# Patient Record
Sex: Female | Born: 1980 | Race: White | Hispanic: No | Marital: Single | Smoking: Never smoker
Health system: Southern US, Community
[De-identification: ages and names within clinical notes are randomized; demographics above are authoritative.]

## PROBLEM LIST (undated history)

## (undated) DIAGNOSIS — O24419 Gestational diabetes mellitus in pregnancy, unspecified control: Secondary | ICD-10-CM

## (undated) HISTORY — DX: Gestational diabetes mellitus in pregnancy, unspecified control: O24.419

---

## 2017-02-15 DIAGNOSIS — F411 Generalized anxiety disorder: Secondary | ICD-10-CM | POA: Diagnosis not present

## 2017-03-01 DIAGNOSIS — F411 Generalized anxiety disorder: Secondary | ICD-10-CM | POA: Diagnosis not present

## 2017-03-15 DIAGNOSIS — F411 Generalized anxiety disorder: Secondary | ICD-10-CM | POA: Diagnosis not present

## 2017-03-29 DIAGNOSIS — F411 Generalized anxiety disorder: Secondary | ICD-10-CM | POA: Diagnosis not present

## 2017-04-19 DIAGNOSIS — F411 Generalized anxiety disorder: Secondary | ICD-10-CM | POA: Diagnosis not present

## 2017-05-10 DIAGNOSIS — F411 Generalized anxiety disorder: Secondary | ICD-10-CM | POA: Diagnosis not present

## 2017-05-24 DIAGNOSIS — F411 Generalized anxiety disorder: Secondary | ICD-10-CM | POA: Diagnosis not present

## 2017-06-21 DIAGNOSIS — F411 Generalized anxiety disorder: Secondary | ICD-10-CM | POA: Diagnosis not present

## 2017-07-13 DIAGNOSIS — F411 Generalized anxiety disorder: Secondary | ICD-10-CM | POA: Diagnosis not present

## 2017-08-02 DIAGNOSIS — F411 Generalized anxiety disorder: Secondary | ICD-10-CM | POA: Diagnosis not present

## 2017-09-06 DIAGNOSIS — F411 Generalized anxiety disorder: Secondary | ICD-10-CM | POA: Diagnosis not present

## 2017-09-07 DIAGNOSIS — Z23 Encounter for immunization: Secondary | ICD-10-CM | POA: Diagnosis not present

## 2017-10-11 DIAGNOSIS — F411 Generalized anxiety disorder: Secondary | ICD-10-CM | POA: Diagnosis not present

## 2017-11-15 DIAGNOSIS — F411 Generalized anxiety disorder: Secondary | ICD-10-CM | POA: Diagnosis not present

## 2017-12-08 DIAGNOSIS — Z01419 Encounter for gynecological examination (general) (routine) without abnormal findings: Secondary | ICD-10-CM | POA: Diagnosis not present

## 2017-12-08 DIAGNOSIS — Z113 Encounter for screening for infections with a predominantly sexual mode of transmission: Secondary | ICD-10-CM | POA: Diagnosis not present

## 2017-12-08 DIAGNOSIS — Z6823 Body mass index (BMI) 23.0-23.9, adult: Secondary | ICD-10-CM | POA: Diagnosis not present

## 2017-12-08 DIAGNOSIS — Z124 Encounter for screening for malignant neoplasm of cervix: Secondary | ICD-10-CM | POA: Diagnosis not present

## 2017-12-13 DIAGNOSIS — F411 Generalized anxiety disorder: Secondary | ICD-10-CM | POA: Diagnosis not present

## 2018-01-13 DIAGNOSIS — F411 Generalized anxiety disorder: Secondary | ICD-10-CM | POA: Diagnosis not present

## 2018-01-24 DIAGNOSIS — D229 Melanocytic nevi, unspecified: Secondary | ICD-10-CM | POA: Diagnosis not present

## 2018-02-07 DIAGNOSIS — F411 Generalized anxiety disorder: Secondary | ICD-10-CM | POA: Diagnosis not present

## 2018-03-14 DIAGNOSIS — F411 Generalized anxiety disorder: Secondary | ICD-10-CM | POA: Diagnosis not present

## 2018-04-11 DIAGNOSIS — F411 Generalized anxiety disorder: Secondary | ICD-10-CM | POA: Diagnosis not present

## 2018-05-16 DIAGNOSIS — F411 Generalized anxiety disorder: Secondary | ICD-10-CM | POA: Diagnosis not present

## 2018-06-13 DIAGNOSIS — F411 Generalized anxiety disorder: Secondary | ICD-10-CM | POA: Diagnosis not present

## 2018-07-07 DIAGNOSIS — F411 Generalized anxiety disorder: Secondary | ICD-10-CM | POA: Diagnosis not present

## 2018-08-12 DIAGNOSIS — F411 Generalized anxiety disorder: Secondary | ICD-10-CM | POA: Diagnosis not present

## 2018-09-05 DIAGNOSIS — F411 Generalized anxiety disorder: Secondary | ICD-10-CM | POA: Diagnosis not present

## 2018-10-03 DIAGNOSIS — F411 Generalized anxiety disorder: Secondary | ICD-10-CM | POA: Diagnosis not present

## 2018-12-07 DIAGNOSIS — Z6823 Body mass index (BMI) 23.0-23.9, adult: Secondary | ICD-10-CM | POA: Diagnosis not present

## 2018-12-07 DIAGNOSIS — Z124 Encounter for screening for malignant neoplasm of cervix: Secondary | ICD-10-CM | POA: Diagnosis not present

## 2018-12-07 DIAGNOSIS — Z01419 Encounter for gynecological examination (general) (routine) without abnormal findings: Secondary | ICD-10-CM | POA: Diagnosis not present

## 2018-12-08 DIAGNOSIS — Z124 Encounter for screening for malignant neoplasm of cervix: Secondary | ICD-10-CM | POA: Diagnosis not present

## 2019-01-03 DIAGNOSIS — J111 Influenza due to unidentified influenza virus with other respiratory manifestations: Secondary | ICD-10-CM | POA: Diagnosis not present

## 2019-06-20 ENCOUNTER — Other Ambulatory Visit: Payer: Self-pay

## 2019-06-20 DIAGNOSIS — Z20822 Contact with and (suspected) exposure to covid-19: Secondary | ICD-10-CM

## 2019-06-21 LAB — NOVEL CORONAVIRUS, NAA: SARS-CoV-2, NAA: NOT DETECTED

## 2019-06-22 ENCOUNTER — Telehealth: Payer: Self-pay

## 2019-06-22 NOTE — Telephone Encounter (Signed)
Pt. Called back, given COVID 19 results. 

## 2019-09-20 DIAGNOSIS — Z20828 Contact with and (suspected) exposure to other viral communicable diseases: Secondary | ICD-10-CM | POA: Diagnosis not present

## 2019-09-20 DIAGNOSIS — Z7189 Other specified counseling: Secondary | ICD-10-CM | POA: Diagnosis not present

## 2019-09-23 DIAGNOSIS — Z20828 Contact with and (suspected) exposure to other viral communicable diseases: Secondary | ICD-10-CM | POA: Diagnosis not present

## 2019-09-25 DIAGNOSIS — Z7189 Other specified counseling: Secondary | ICD-10-CM | POA: Diagnosis not present

## 2019-09-25 DIAGNOSIS — Z20828 Contact with and (suspected) exposure to other viral communicable diseases: Secondary | ICD-10-CM | POA: Diagnosis not present

## 2019-09-25 DIAGNOSIS — Z03818 Encounter for observation for suspected exposure to other biological agents ruled out: Secondary | ICD-10-CM | POA: Diagnosis not present

## 2019-09-26 DIAGNOSIS — Z20828 Contact with and (suspected) exposure to other viral communicable diseases: Secondary | ICD-10-CM | POA: Diagnosis not present

## 2019-12-06 DIAGNOSIS — F4320 Adjustment disorder, unspecified: Secondary | ICD-10-CM | POA: Diagnosis not present

## 2019-12-12 DIAGNOSIS — F4320 Adjustment disorder, unspecified: Secondary | ICD-10-CM | POA: Diagnosis not present

## 2019-12-19 DIAGNOSIS — F4320 Adjustment disorder, unspecified: Secondary | ICD-10-CM | POA: Diagnosis not present

## 2019-12-21 DIAGNOSIS — Z6824 Body mass index (BMI) 24.0-24.9, adult: Secondary | ICD-10-CM | POA: Diagnosis not present

## 2019-12-21 DIAGNOSIS — Z01419 Encounter for gynecological examination (general) (routine) without abnormal findings: Secondary | ICD-10-CM | POA: Diagnosis not present

## 2019-12-21 DIAGNOSIS — Z113 Encounter for screening for infections with a predominantly sexual mode of transmission: Secondary | ICD-10-CM | POA: Diagnosis not present

## 2019-12-21 DIAGNOSIS — Z124 Encounter for screening for malignant neoplasm of cervix: Secondary | ICD-10-CM | POA: Diagnosis not present

## 2019-12-26 DIAGNOSIS — F4320 Adjustment disorder, unspecified: Secondary | ICD-10-CM | POA: Diagnosis not present

## 2020-01-02 DIAGNOSIS — F4320 Adjustment disorder, unspecified: Secondary | ICD-10-CM | POA: Diagnosis not present

## 2020-01-09 DIAGNOSIS — F4320 Adjustment disorder, unspecified: Secondary | ICD-10-CM | POA: Diagnosis not present

## 2020-01-24 DIAGNOSIS — F4320 Adjustment disorder, unspecified: Secondary | ICD-10-CM | POA: Diagnosis not present

## 2020-02-08 DIAGNOSIS — F4320 Adjustment disorder, unspecified: Secondary | ICD-10-CM | POA: Diagnosis not present

## 2020-02-27 DIAGNOSIS — F4320 Adjustment disorder, unspecified: Secondary | ICD-10-CM | POA: Diagnosis not present

## 2020-03-12 DIAGNOSIS — F4320 Adjustment disorder, unspecified: Secondary | ICD-10-CM | POA: Diagnosis not present

## 2020-04-09 DIAGNOSIS — F4320 Adjustment disorder, unspecified: Secondary | ICD-10-CM | POA: Diagnosis not present

## 2020-04-23 DIAGNOSIS — F4320 Adjustment disorder, unspecified: Secondary | ICD-10-CM | POA: Diagnosis not present

## 2020-05-07 DIAGNOSIS — F4320 Adjustment disorder, unspecified: Secondary | ICD-10-CM | POA: Diagnosis not present

## 2020-06-04 DIAGNOSIS — F4320 Adjustment disorder, unspecified: Secondary | ICD-10-CM | POA: Diagnosis not present

## 2020-07-02 DIAGNOSIS — F4320 Adjustment disorder, unspecified: Secondary | ICD-10-CM | POA: Diagnosis not present

## 2020-07-06 DIAGNOSIS — Z20828 Contact with and (suspected) exposure to other viral communicable diseases: Secondary | ICD-10-CM | POA: Diagnosis not present

## 2020-07-12 DIAGNOSIS — F4320 Adjustment disorder, unspecified: Secondary | ICD-10-CM | POA: Diagnosis not present

## 2020-07-17 DIAGNOSIS — F4320 Adjustment disorder, unspecified: Secondary | ICD-10-CM | POA: Diagnosis not present

## 2020-08-06 DIAGNOSIS — F4320 Adjustment disorder, unspecified: Secondary | ICD-10-CM | POA: Diagnosis not present

## 2020-08-08 DIAGNOSIS — Z23 Encounter for immunization: Secondary | ICD-10-CM | POA: Diagnosis not present

## 2020-08-14 DIAGNOSIS — F4320 Adjustment disorder, unspecified: Secondary | ICD-10-CM | POA: Diagnosis not present

## 2020-08-28 DIAGNOSIS — F4320 Adjustment disorder, unspecified: Secondary | ICD-10-CM | POA: Diagnosis not present

## 2020-09-03 DIAGNOSIS — F4320 Adjustment disorder, unspecified: Secondary | ICD-10-CM | POA: Diagnosis not present

## 2020-09-11 DIAGNOSIS — F4320 Adjustment disorder, unspecified: Secondary | ICD-10-CM | POA: Diagnosis not present

## 2020-09-25 DIAGNOSIS — F4320 Adjustment disorder, unspecified: Secondary | ICD-10-CM | POA: Diagnosis not present

## 2020-10-09 DIAGNOSIS — F4320 Adjustment disorder, unspecified: Secondary | ICD-10-CM | POA: Diagnosis not present

## 2020-10-23 DIAGNOSIS — F4320 Adjustment disorder, unspecified: Secondary | ICD-10-CM | POA: Diagnosis not present

## 2020-11-06 DIAGNOSIS — F4320 Adjustment disorder, unspecified: Secondary | ICD-10-CM | POA: Diagnosis not present

## 2020-11-20 DIAGNOSIS — F4320 Adjustment disorder, unspecified: Secondary | ICD-10-CM | POA: Diagnosis not present

## 2020-11-28 ENCOUNTER — Ambulatory Visit: Payer: Self-pay | Admitting: Physician Assistant

## 2021-01-01 DIAGNOSIS — F4320 Adjustment disorder, unspecified: Secondary | ICD-10-CM | POA: Diagnosis not present

## 2021-01-09 ENCOUNTER — Other Ambulatory Visit: Payer: Self-pay

## 2021-01-09 ENCOUNTER — Encounter: Payer: Self-pay | Admitting: Dermatology

## 2021-01-09 ENCOUNTER — Ambulatory Visit (INDEPENDENT_AMBULATORY_CARE_PROVIDER_SITE_OTHER): Payer: BC Managed Care – PPO | Admitting: Dermatology

## 2021-01-09 DIAGNOSIS — L738 Other specified follicular disorders: Secondary | ICD-10-CM

## 2021-01-09 DIAGNOSIS — L729 Follicular cyst of the skin and subcutaneous tissue, unspecified: Secondary | ICD-10-CM

## 2021-01-09 DIAGNOSIS — Z87898 Personal history of other specified conditions: Secondary | ICD-10-CM

## 2021-01-09 DIAGNOSIS — Z1283 Encounter for screening for malignant neoplasm of skin: Secondary | ICD-10-CM

## 2021-01-09 DIAGNOSIS — Z86018 Personal history of other benign neoplasm: Secondary | ICD-10-CM

## 2021-01-09 DIAGNOSIS — B079 Viral wart, unspecified: Secondary | ICD-10-CM

## 2021-01-09 NOTE — Patient Instructions (Signed)
[email or web address]

## 2021-01-27 ENCOUNTER — Encounter: Payer: Self-pay | Admitting: Dermatology

## 2021-01-27 NOTE — Progress Notes (Signed)
   New Patient   Subjective  Tamara Mccormick is a 40 y.o. female who presents for the following: Annual Exam (Right wrist wart she picks, check mole on back raised).  General skin check plus several spots to discuss Location:  Duration:  Quality:  Associated Signs/Symptoms: Modifying Factors:  Severity:  Timing: Context: History of atypical mole.   The following portions of the chart were reviewed this encounter and updated as appropriate:  Tobacco  Allergies  Meds  Problems  Med Hx  Surg Hx  Fam Hx      Objective  Well appearing patient in no apparent distress; mood and affect are within normal limits. Objective  Chest - [facility]): Head to toe exam clear no atypical moles no skin cancer   Objective  Right Thigh - Anterior: Previous removal per patient scar clear  Objective  Mid Back: Mid back black/grey area: 8 mm open epidermoid cyst.  Objective  Right Temporal Scalp: Stable, previous tried home surgery per patient: Two millimeter of cream-colored papule with eccentric dell.  Objective  Left Forearm - Posterior: Warty 3 to 4 mm papule which may represent a viral wart, pickers papule, or irritated keratosis.  Clinically benign and historically stable.    A full examination was performed including scalp, head, eyes, ears, nose, lips, neck, chest, axillae, abdomen, back, buttocks, bilateral upper extremities, bilateral lower extremities, hands, feet, fingers, toes, fingernails, and toenails. All findings within normal limits unless otherwise noted below.  Areas beneath undergarments not examined.   Assessment & Plan  Screening exam for skin cancer Chest - [facility])  History of atypical nevus Right Thigh - Anterior  Annual skin examination, patient encouraged to self examine twice annually.  Continued use of ultraviolet protection.  Cyst of skin Mid Back  Patient may choose to schedule 30 minutes for minor surgery.  Risks  reviewed.  Sebaceous hyperplasia of face Right Temporal Scalp  Benign nature of sebaceous hyperplasia discussed.  Mimicry to early basal cell carcinoma also reviewed.  For now, we will leave the spot if it is clinically stable.  Viral warts, unspecified type Left Forearm - Posterior  Patient will decide if she wants to schedule future removal.

## 2021-02-06 DIAGNOSIS — Z1231 Encounter for screening mammogram for malignant neoplasm of breast: Secondary | ICD-10-CM | POA: Diagnosis not present

## 2021-02-06 DIAGNOSIS — Z124 Encounter for screening for malignant neoplasm of cervix: Secondary | ICD-10-CM | POA: Diagnosis not present

## 2021-02-06 DIAGNOSIS — Z31430 Encounter of female for testing for genetic disease carrier status for procreative management: Secondary | ICD-10-CM | POA: Diagnosis not present

## 2021-02-06 DIAGNOSIS — Z01419 Encounter for gynecological examination (general) (routine) without abnormal findings: Secondary | ICD-10-CM | POA: Diagnosis not present

## 2021-02-10 ENCOUNTER — Other Ambulatory Visit: Payer: Self-pay | Admitting: Obstetrics and Gynecology

## 2021-02-10 DIAGNOSIS — R928 Other abnormal and inconclusive findings on diagnostic imaging of breast: Secondary | ICD-10-CM

## 2021-03-04 ENCOUNTER — Ambulatory Visit
Admission: RE | Admit: 2021-03-04 | Discharge: 2021-03-04 | Disposition: A | Discharge status: Home / Self Care | Payer: BC Managed Care – PPO | Source: Ambulatory Visit | Attending: Obstetrics and Gynecology | Admitting: Obstetrics and Gynecology

## 2021-03-04 ENCOUNTER — Other Ambulatory Visit: Payer: Self-pay

## 2021-03-04 DIAGNOSIS — R922 Inconclusive mammogram: Secondary | ICD-10-CM | POA: Diagnosis not present

## 2021-03-04 DIAGNOSIS — N6002 Solitary cyst of left breast: Secondary | ICD-10-CM | POA: Diagnosis not present

## 2021-03-04 DIAGNOSIS — R928 Other abnormal and inconclusive findings on diagnostic imaging of breast: Secondary | ICD-10-CM

## 2021-04-16 DIAGNOSIS — N760 Acute vaginitis: Secondary | ICD-10-CM | POA: Diagnosis not present

## 2021-07-07 DIAGNOSIS — Z3201 Encounter for pregnancy test, result positive: Secondary | ICD-10-CM | POA: Diagnosis not present

## 2021-07-16 DIAGNOSIS — N925 Other specified irregular menstruation: Secondary | ICD-10-CM | POA: Diagnosis not present

## 2021-07-16 DIAGNOSIS — O3680X1 Pregnancy with inconclusive fetal viability, fetus 1: Secondary | ICD-10-CM | POA: Diagnosis not present

## 2021-07-16 DIAGNOSIS — Z113 Encounter for screening for infections with a predominantly sexual mode of transmission: Secondary | ICD-10-CM | POA: Diagnosis not present

## 2021-07-16 DIAGNOSIS — Z3480 Encounter for supervision of other normal pregnancy, unspecified trimester: Secondary | ICD-10-CM | POA: Diagnosis not present

## 2021-07-31 DIAGNOSIS — O09511 Supervision of elderly primigravida, first trimester: Secondary | ICD-10-CM | POA: Diagnosis not present

## 2021-07-31 DIAGNOSIS — Z3480 Encounter for supervision of other normal pregnancy, unspecified trimester: Secondary | ICD-10-CM | POA: Diagnosis not present

## 2021-07-31 DIAGNOSIS — Z3A09 9 weeks gestation of pregnancy: Secondary | ICD-10-CM | POA: Diagnosis not present

## 2021-07-31 LAB — OB RESULTS CONSOLE RUBELLA ANTIBODY, IGM: Rubella: IMMUNE

## 2021-07-31 LAB — OB RESULTS CONSOLE ABO/RH: RH Type: NEGATIVE

## 2021-07-31 LAB — OB RESULTS CONSOLE RPR: RPR: NONREACTIVE

## 2021-07-31 LAB — OB RESULTS CONSOLE GC/CHLAMYDIA
Chlamydia: NEGATIVE
Gonorrhea: NEGATIVE

## 2021-07-31 LAB — OB RESULTS CONSOLE ANTIBODY SCREEN: Antibody Screen: NEGATIVE

## 2021-07-31 LAB — OB RESULTS CONSOLE HIV ANTIBODY (ROUTINE TESTING): HIV: NONREACTIVE

## 2021-07-31 LAB — HEPATITIS C ANTIBODY: HCV Ab: NEGATIVE

## 2021-07-31 LAB — OB RESULTS CONSOLE HEPATITIS B SURFACE ANTIGEN: Hepatitis B Surface Ag: NEGATIVE

## 2021-08-08 DIAGNOSIS — Z23 Encounter for immunization: Secondary | ICD-10-CM | POA: Diagnosis not present

## 2021-08-27 DIAGNOSIS — Z369 Encounter for antenatal screening, unspecified: Secondary | ICD-10-CM | POA: Diagnosis not present

## 2021-09-08 DIAGNOSIS — Z3482 Encounter for supervision of other normal pregnancy, second trimester: Secondary | ICD-10-CM | POA: Diagnosis not present

## 2021-09-30 DIAGNOSIS — Z363 Encounter for antenatal screening for malformations: Secondary | ICD-10-CM | POA: Diagnosis not present

## 2021-09-30 DIAGNOSIS — Z3A18 18 weeks gestation of pregnancy: Secondary | ICD-10-CM | POA: Diagnosis not present

## 2021-10-28 DIAGNOSIS — Z369 Encounter for antenatal screening, unspecified: Secondary | ICD-10-CM | POA: Diagnosis not present

## 2021-11-09 NOTE — L&D Delivery Note (Signed)
Patient was C/C/+3 and pushed for 10 minutes withOUT epidural.    ?NSVD  female infant, Apgars 9.9, weight P.   ?The patient had a midline perineal second degree laceration repaired with 2-0 Vicryl E. ?Fundus was firm. ?EBL was expected amount. ?Placenta was delivered intact. ?Vagina was clear. ? ?Delayed cord clamping done for 30-60 seconds while warming baby. ?Baby was vigorous and doing skin to skin with mother. ? ?Loney Laurence  ?

## 2021-12-31 ENCOUNTER — Encounter: Payer: BC Managed Care – PPO | Attending: Obstetrics and Gynecology | Admitting: Registered"

## 2021-12-31 ENCOUNTER — Other Ambulatory Visit: Payer: Self-pay

## 2021-12-31 ENCOUNTER — Encounter: Payer: Self-pay | Admitting: Registered"

## 2021-12-31 DIAGNOSIS — O24419 Gestational diabetes mellitus in pregnancy, unspecified control: Secondary | ICD-10-CM | POA: Insufficient documentation

## 2021-12-31 NOTE — Progress Notes (Signed)
Patient was seen on 12/31/21 for Gestational Diabetes self-management class at the [facility]. The following learning objectives were met by the patient during this course:  States the definition of Gestational Diabetes States why dietary management is important in controlling blood glucose Describes the effects each nutrient has on blood glucose levels Demonstrates ability to create a balanced meal plan Demonstrates carbohydrate counting  States when to check blood glucose levels Demonstrates proper blood glucose monitoring techniques States the effect of stress and exercise on blood glucose levels States the importance of limiting caffeine and abstaining from alcohol and smoking  Blood glucose monitor given: Accu-chek Guide Me Lot #[id number] Exp: 01/26/2023 CBG: 87 mg/dL   Patient instructed to monitor glucose levels: FBS: 60 - <95; 1 hour: <140; 2 hour: <120  Patient received handouts: Nutrition Diabetes and Pregnancy, including carb counting list  Patient will be seen for follow-up as needed.

## 2022-01-12 ENCOUNTER — Ambulatory Visit: Payer: BC Managed Care – PPO | Admitting: Dermatology

## 2022-01-27 ENCOUNTER — Ambulatory Visit (INDEPENDENT_AMBULATORY_CARE_PROVIDER_SITE_OTHER): Payer: BC Managed Care – PPO | Admitting: Dermatology

## 2022-01-27 ENCOUNTER — Encounter: Payer: Self-pay | Admitting: Dermatology

## 2022-01-27 ENCOUNTER — Other Ambulatory Visit: Payer: Self-pay

## 2022-01-27 DIAGNOSIS — Z1283 Encounter for screening for malignant neoplasm of skin: Secondary | ICD-10-CM | POA: Diagnosis not present

## 2022-01-27 DIAGNOSIS — L729 Follicular cyst of the skin and subcutaneous tissue, unspecified: Secondary | ICD-10-CM | POA: Diagnosis not present

## 2022-02-05 LAB — OB RESULTS CONSOLE GBS: GBS: NEGATIVE

## 2022-02-08 ENCOUNTER — Encounter: Payer: Self-pay | Admitting: Dermatology

## 2022-02-08 NOTE — Progress Notes (Signed)
? ?  Follow-Up Visit ?  ?Subjective  ?Tamara Mccormick is a 41 y.o. female who presents for the following: Skin Problem (Lesion on back x all her life- the shape is changing. ). ? ?General skin examination, change in spot on back ?Location:  ?Duration:  ?Quality:  ?Associated Signs/Symptoms: ?Modifying Factors:  ?Severity:  ?Timing: ?Context:  ? ?Objective  ?Well appearing patient in no apparent distress; mood and affect are within normal limits. ?Pt is approx 8 mo pregnant. Due date 4/21.  I explained normal changes in pigmentation including loss during pregnancy.  Clinically no visibly atypical moles, all checked with dermoscopy. ? ?Mid Back ?1 cm noninflamed open cyst with Maczis 1 mm.  Centrally. ? ? ? ?All sun exposed areas plus back examined. ? ? ?Assessment & Plan  ? ? ?Screening exam for skin cancer ? ?May recheck if there is clinical change or every 1 to 2 years postpartum.  Continue ultraviolet protection. ? ?Cyst of skin ?Mid Back ? ?Discussed elective excision, patient chooses to leave unless there is clinical change. ? ? ? ? ? ?I, Janalyn Harder, MD, have reviewed all documentation for this visit.  The documentation on 02/08/22 for the exam, diagnosis, procedures, and orders are all accurate and complete. ?

## 2022-02-20 ENCOUNTER — Telehealth (HOSPITAL_COMMUNITY): Payer: Self-pay | Admitting: *Deleted

## 2022-02-20 ENCOUNTER — Encounter (HOSPITAL_COMMUNITY): Payer: Self-pay | Admitting: *Deleted

## 2022-02-20 NOTE — Telephone Encounter (Signed)
Preadmission screen  

## 2022-02-23 ENCOUNTER — Telehealth (HOSPITAL_COMMUNITY): Payer: Self-pay | Admitting: *Deleted

## 2022-02-23 ENCOUNTER — Encounter (HOSPITAL_COMMUNITY): Payer: Self-pay | Admitting: *Deleted

## 2022-02-23 NOTE — Telephone Encounter (Signed)
Preadmission screen  

## 2022-02-24 ENCOUNTER — Other Ambulatory Visit: Payer: Self-pay | Admitting: Obstetrics and Gynecology

## 2022-02-25 ENCOUNTER — Inpatient Hospital Stay (HOSPITAL_COMMUNITY)
Admission: AD | Admit: 2022-02-25 | Discharge: 2022-02-27 | DRG: 807 | Disposition: A | Discharge status: Home / Self Care | Payer: BC Managed Care – PPO | Attending: Obstetrics and Gynecology | Admitting: Obstetrics and Gynecology

## 2022-02-25 ENCOUNTER — Encounter (HOSPITAL_COMMUNITY): Payer: Self-pay | Admitting: Obstetrics and Gynecology

## 2022-02-25 ENCOUNTER — Other Ambulatory Visit: Payer: Self-pay

## 2022-02-25 ENCOUNTER — Inpatient Hospital Stay (HOSPITAL_COMMUNITY): Payer: BC Managed Care – PPO

## 2022-02-25 DIAGNOSIS — O24420 Gestational diabetes mellitus in childbirth, diet controlled: Secondary | ICD-10-CM | POA: Diagnosis present

## 2022-02-25 DIAGNOSIS — Z6791 Unspecified blood type, Rh negative: Secondary | ICD-10-CM | POA: Diagnosis not present

## 2022-02-25 DIAGNOSIS — Z3A39 39 weeks gestation of pregnancy: Secondary | ICD-10-CM | POA: Diagnosis not present

## 2022-02-25 DIAGNOSIS — O26893 Other specified pregnancy related conditions, third trimester: Principal | ICD-10-CM | POA: Diagnosis present

## 2022-02-25 DIAGNOSIS — O09513 Supervision of elderly primigravida, third trimester: Principal | ICD-10-CM | POA: Diagnosis present

## 2022-02-25 LAB — CBC
HCT: 38.3 % (ref 36.0–46.0)
Hemoglobin: 12.6 g/dL (ref 12.0–15.0)
MCH: 28.1 pg (ref 26.0–34.0)
MCHC: 32.9 g/dL (ref 30.0–36.0)
MCV: 85.5 fL (ref 80.0–100.0)
Platelets: 219 10*3/uL (ref 150–400)
RBC: 4.48 MIL/uL (ref 3.87–5.11)
RDW: 13.3 % (ref 11.5–15.5)
WBC: 8.2 10*3/uL (ref 4.0–10.5)
nRBC: 0 % (ref 0.0–0.2)

## 2022-02-25 LAB — TYPE AND SCREEN
ABO/RH(D): O NEG
Antibody Screen: POSITIVE

## 2022-02-25 LAB — RPR: RPR Ser Ql: NONREACTIVE

## 2022-02-25 MED ORDER — LACTATED RINGERS IV SOLN: 500.0000 mL | INTRAVENOUS | Status: DC | PRN

## 2022-02-25 MED ORDER — ONDANSETRON HCL 4 MG/2ML IJ SOLN: 4.0000 mg | Freq: Four times a day (QID) | INTRAMUSCULAR | Status: DC | PRN

## 2022-02-25 MED ORDER — OXYTOCIN-SODIUM CHLORIDE 30-0.9 UT/500ML-% IV SOLN
1.0000 m[IU]/min | INTRAVENOUS | Status: DC
  Administered 2022-02-25: 2 m[IU]/min via INTRAVENOUS
  Filled 2022-02-25 (×2): qty 500

## 2022-02-25 MED ORDER — FLEET ENEMA 7-19 GM/118ML RE ENEM: 1.0000 | ENEMA | RECTAL | Status: DC | PRN

## 2022-02-25 MED ORDER — ACETAMINOPHEN 325 MG PO TABS
650.0000 mg | ORAL_TABLET | ORAL | Status: DC | PRN
Start: 2022-02-25 — End: 2022-02-26

## 2022-02-25 MED ORDER — OXYCODONE-ACETAMINOPHEN 5-325 MG PO TABS: 1.0000 | ORAL_TABLET | ORAL | Status: DC | PRN

## 2022-02-25 MED ORDER — LIDOCAINE HCL (PF) 1 % IJ SOLN
30.0000 mL | INTRAMUSCULAR | Status: AC | PRN
  Administered 2022-02-26: 30 mL via SUBCUTANEOUS
  Filled 2022-02-25: qty 30

## 2022-02-25 MED ORDER — OXYTOCIN BOLUS FROM INFUSION
333.0000 mL | Freq: Once | INTRAVENOUS | Status: AC
  Administered 2022-02-26: 333 mL via INTRAVENOUS

## 2022-02-25 MED ORDER — LACTATED RINGERS IV SOLN: INTRAVENOUS | Status: DC

## 2022-02-25 MED ORDER — SOD CITRATE-CITRIC ACID 500-334 MG/5ML PO SOLN: 30.0000 mL | ORAL | Status: DC | PRN

## 2022-02-25 MED ORDER — TERBUTALINE SULFATE 1 MG/ML IJ SOLN: 0.2500 mg | Freq: Once | INTRAMUSCULAR | Status: DC | PRN

## 2022-02-25 MED ORDER — OXYCODONE-ACETAMINOPHEN 5-325 MG PO TABS: 2.0000 | ORAL_TABLET | ORAL | Status: DC | PRN

## 2022-02-25 MED ORDER — OXYTOCIN-SODIUM CHLORIDE 30-0.9 UT/500ML-% IV SOLN: 2.5000 [IU]/h | INTRAVENOUS | Status: DC

## 2022-02-25 NOTE — Progress Notes (Signed)
Offered cervical exam, pt deferred at this time and would like to wait a little bit longer. ?

## 2022-02-25 NOTE — Progress Notes (Signed)
Pt doing well without epidural. ? ?Vitals:  ? 02/25/22 0958 02/25/22 1056 02/25/22 1255 02/25/22 1344  ?BP: 118/82 120/66 104/78 107/69  ?Pulse: 80 76 78 82  ?Resp: 16 18 16    ?Temp: 98.3 ?F (36.8 ?C)     ?TempSrc: Oral     ?  ?FHTs 130s, gSTV, NST R, occ mild variable ?Toco q 3 ?SVE not checked since this am. ? ?A/P Continue induction at term.  ?

## 2022-02-25 NOTE — H&P (Signed)
41 y.o. [redacted]w[redacted]d  G1P0 comes in induction at term for AMA.  Otherwise has good fetal movement and no bleeding.  Pt had foley bulb placed in office yesterday and spontaneously fell out last night. ? ?Past Medical History:  ?Diagnosis Date  ? Gestational diabetes   ? No past surgical history on file.  ?OB History  ?Gravida Para Term Preterm AB Living  ?1            ?SAB IAB Ectopic Multiple Live Births  ?           ?  ?# Outcome Date GA Lbr Len/2nd Weight Sex Delivery Anes PTL Lv  ?1 Current           ?  ?Social History  ? ?Socioeconomic History  ? Marital status: Single  ?  Spouse name: Not on file  ? Number of children: Not on file  ? Years of education: Not on file  ? Highest education level: Not on file  ?Occupational History  ? Not on file  ?Tobacco Use  ? Smoking status: Never  ? Smokeless tobacco: Never  ?Vaping Use  ? Vaping Use: Never used  ?Substance and Sexual Activity  ? Alcohol use: Not Currently  ? Drug use: Never  ? Sexual activity: Not on file  ?Other Topics Concern  ? Not on file  ?Social History Narrative  ? Not on file  ? ?Social Determinants of Health  ? ?Financial Resource Strain: Not on file  ?Food Insecurity: Not on file  ?Transportation Needs: Not on file  ?Physical Activity: Not on file  ?Stress: Not on file  ?Social Connections: Not on file  ?Intimate Partner Violence: Not on file  ? Patient has no known allergies.  ? ? ?Prenatal Transfer Tool  ?Maternal Diabetes: Yes:  Diabetes Type:  Diet controlled ?Genetic Screening: Normal- low risk NIPT ?Maternal Ultrasounds/Referrals: Normal ?Fetal Ultrasounds or other Referrals:  None ?Maternal Substance Abuse:  No ?Significant Maternal Medications:  Meds include: Zoloft ?Significant Maternal Lab Results: GBS negative received Rhogam 28 weeks. ? ?Other PNC: Otherwise uncomplicated. ? ? ? ?There were no vitals filed for this visit. ? ?Lungs/Cor:  NAD ?Abdomen:  soft, gravid ?Ex:  no cords, erythema ?SVE:  4/60/-2 ?FHTs:  120s, good STV, NST R; Cat 1  tracing. ?Toco:  q 4 ? ? ?A/P   Term induction for AMA. ? GBS neg. ? ?Tamara Mccormick  ?

## 2022-02-25 NOTE — Progress Notes (Signed)
Vitals:  ? 02/25/22 1530 02/25/22 1730 02/25/22 1808 02/25/22 1937  ?BP: 116/76 (!) 149/87 120/77 106/72  ?Pulse: 72 89 66 81  ?Resp: 18 16    ?Temp: 97.9 ?F (36.6 ?C)  98.3 ?F (36.8 ?C)   ?TempSrc: Oral     ?Weight:      ?Height:      ?  ?FHTs 120s, gSTV, NST R ?Toco q 3-4 ?SVE pt has declined exam since AROM. ? ?Continue pitocin induction.  ?

## 2022-02-26 ENCOUNTER — Encounter (HOSPITAL_COMMUNITY): Payer: Self-pay | Admitting: Obstetrics and Gynecology

## 2022-02-26 MED ORDER — DIPHENHYDRAMINE HCL 25 MG PO CAPS: 25.0000 mg | ORAL_CAPSULE | Freq: Four times a day (QID) | ORAL | Status: DC | PRN

## 2022-02-26 MED ORDER — ONDANSETRON HCL 4 MG PO TABS: 4.0000 mg | ORAL_TABLET | ORAL | Status: DC | PRN

## 2022-02-26 MED ORDER — SIMETHICONE 80 MG PO CHEW: 80.0000 mg | CHEWABLE_TABLET | ORAL | Status: DC | PRN

## 2022-02-26 MED ORDER — SODIUM CHLORIDE 0.9% FLUSH: 3.0000 mL | Freq: Two times a day (BID) | INTRAVENOUS | Status: DC

## 2022-02-26 MED ORDER — METHYLERGONOVINE MALEATE 0.2 MG/ML IJ SOLN
0.2000 mg | INTRAMUSCULAR | Status: AC
  Administered 2022-02-26: 0.2 mg via INTRAMUSCULAR
  Filled 2022-02-26: qty 1

## 2022-02-26 MED ORDER — ZOLPIDEM TARTRATE 5 MG PO TABS: 5.0000 mg | ORAL_TABLET | Freq: Every evening | ORAL | Status: DC | PRN

## 2022-02-26 MED ORDER — SENNOSIDES-DOCUSATE SODIUM 8.6-50 MG PO TABS
2.0000 | ORAL_TABLET | Freq: Every day | ORAL | Status: DC
  Administered 2022-02-27: 2 via ORAL
  Filled 2022-02-26: qty 2

## 2022-02-26 MED ORDER — METHYLERGONOVINE MALEATE 0.2 MG/ML IJ SOLN: 0.2000 mg | INTRAMUSCULAR | Status: DC | PRN

## 2022-02-26 MED ORDER — PRENATAL MULTIVITAMIN CH
1.0000 | ORAL_TABLET | Freq: Every day | ORAL | Status: DC
  Administered 2022-02-26 – 2022-02-27 (×2): 1 via ORAL
  Filled 2022-02-26 (×2): qty 1

## 2022-02-26 MED ORDER — MEASLES, MUMPS & RUBELLA VAC IJ SOLR: 0.5000 mL | Freq: Once | INTRAMUSCULAR | Status: DC

## 2022-02-26 MED ORDER — TETANUS-DIPHTH-ACELL PERTUSSIS 5-2.5-18.5 LF-MCG/0.5 IM SUSY: 0.5000 mL | PREFILLED_SYRINGE | Freq: Once | INTRAMUSCULAR | Status: DC

## 2022-02-26 MED ORDER — METHYLERGONOVINE MALEATE 0.2 MG/ML IJ SOLN
INTRAMUSCULAR | Status: AC
Start: 2022-02-26 — End: 2022-02-26
  Filled 2022-02-26: qty 1

## 2022-02-26 MED ORDER — IBUPROFEN 800 MG PO TABS
800.0000 mg | ORAL_TABLET | Freq: Three times a day (TID) | ORAL | Status: DC
  Administered 2022-02-26 – 2022-02-27 (×4): 800 mg via ORAL
  Filled 2022-02-26 (×3): qty 1

## 2022-02-26 MED ORDER — ACETAMINOPHEN 325 MG PO TABS
650.0000 mg | ORAL_TABLET | ORAL | Status: DC | PRN
  Administered 2022-02-26 – 2022-02-27 (×3): 650 mg via ORAL
  Filled 2022-02-26 (×3): qty 2

## 2022-02-26 MED ORDER — COCONUT OIL OIL: 1.0000 "application " | TOPICAL_OIL | Status: DC | PRN

## 2022-02-26 MED ORDER — SODIUM CHLORIDE 0.9 % IV SOLN: 250.0000 mL | INTRAVENOUS | Status: DC | PRN

## 2022-02-26 MED ORDER — SERTRALINE HCL 100 MG PO TABS
100.0000 mg | ORAL_TABLET | Freq: Every day | ORAL | Status: DC
  Filled 2022-02-26 (×2): qty 1

## 2022-02-26 MED ORDER — ONDANSETRON HCL 4 MG/2ML IJ SOLN: 4.0000 mg | INTRAMUSCULAR | Status: DC | PRN

## 2022-02-26 MED ORDER — MAGNESIUM HYDROXIDE 400 MG/5ML PO SUSP: 30.0000 mL | ORAL | Status: DC | PRN

## 2022-02-26 MED ORDER — BENZOCAINE-MENTHOL 20-0.5 % EX AERO
1.0000 "application " | INHALATION_SPRAY | CUTANEOUS | Status: DC | PRN
  Administered 2022-02-26: 1 via TOPICAL
  Filled 2022-02-26: qty 56

## 2022-02-26 MED ORDER — SODIUM CHLORIDE 0.9% FLUSH: 3.0000 mL | INTRAVENOUS | Status: DC | PRN

## 2022-02-26 MED ORDER — IBUPROFEN 800 MG PO TABS
ORAL_TABLET | ORAL | Status: AC
  Filled 2022-02-26: qty 1

## 2022-02-26 MED ORDER — METHYLERGONOVINE MALEATE 0.2 MG PO TABS: 0.2000 mg | ORAL_TABLET | ORAL | Status: DC | PRN

## 2022-02-26 MED ORDER — OXYCODONE-ACETAMINOPHEN 5-325 MG PO TABS: 2.0000 | ORAL_TABLET | ORAL | Status: DC | PRN

## 2022-02-26 MED ORDER — WITCH HAZEL-GLYCERIN EX PADS: 1.0000 "application " | MEDICATED_PAD | CUTANEOUS | Status: DC | PRN

## 2022-02-26 MED ORDER — DIBUCAINE (PERIANAL) 1 % EX OINT: 1.0000 "application " | TOPICAL_OINTMENT | CUTANEOUS | Status: DC | PRN

## 2022-02-26 MED ORDER — FERROUS SULFATE 325 (65 FE) MG PO TABS
325.0000 mg | ORAL_TABLET | Freq: Two times a day (BID) | ORAL | Status: DC
  Administered 2022-02-26 – 2022-02-27 (×3): 325 mg via ORAL
  Filled 2022-02-26 (×3): qty 1

## 2022-02-26 NOTE — Lactation Note (Signed)
This note was copied from a baby's chart. ?Lactation Consultation Note ? ?Patient Name: Tamara Mccormick ?Today's Date: 02/26/2022 ?Reason for consult: Initial assessment;Primapara;Term ?Age:42 hours ? ? ?Mom is STS with baby.  Baby is asleep. Mom desired to try latching baby in laid back, tummy to tummy position.  Infant would not wake to latch.   ?Mom hand expressed approx. 3 ml into a spoon.  A drop was placed on baby's lips but baby did not wake.   ? ?LC encouraged STS, feeding with cues, hand expression and storage for EBM for education purposes: Room temp and then refrigerate after 4 hours but to feed colostrum to baby.   ? ?Mom knows to call out for assistance if needed. ?She is aware of BFSG, OP LC appt., and phone line.   ? ?Maternal Data ?Has patient been taught Hand Expression?: Yes ?Does the patient have breastfeeding experience prior to this delivery?: No ? ?Feeding ?Mother's Current Feeding Choice: Breast Milk ? ?LATCH Score ?Latch: Too sleepy or reluctant, no latch achieved, no sucking elicited. ? ?Audible Swallowing: None ? ?Type of Nipple: Everted at rest and after stimulation ? ?Comfort (Breast/Nipple): Soft / non-tender ? ?Hold (Positioning): Assistance needed to correctly position infant at breast and maintain latch. ? ?LATCH Score: 5 ? ? ?Lactation Tools Discussed/Used ?  ? ?Interventions ?Interventions: Breast feeding basics reviewed;Skin to skin;Education;Hand express ? ?Discharge ?Pump: Personal ? ?Consult Status ?Consult Status: Follow-up ?Date: 02/27/22 ?Follow-up type: In-patient ? ? ? ?Maryruth Hancock Emanuelle Hammerstrom ?02/26/2022, 10:41 AM ? ? ? ?

## 2022-02-26 NOTE — Progress Notes (Signed)
MOB was referred for history of depression/anxiety. ?* Referral screened out by Clinical Social Worker because none of the following criteria appear to apply: ?~ History of anxiety/depression during this pregnancy, or of post-partum depression following prior delivery. ?~ Diagnosis of anxiety and/or depression within last 3 years ?OR ?* MOB's symptoms currently being treated with medication and/or therapy. MOB has an active Rx for Zoloft.  No other concerns noted in OB notes.  ? ?Please contact the Clinical Social Worker if needs arise, by MOB request, or if MOB scores greater than 9/yes to question 10 on Edinburgh Postpartum Depression Screen. A ? ?Tamara Mccormick, MSW, LCSW ?Clinical Social Work ?[phone] ?

## 2022-02-26 NOTE — Progress Notes (Signed)
Pt feeling contractions. ? ?Vitals:  ? 02/25/22 1937 02/25/22 2049 02/25/22 2306 02/26/22 0029  ?BP: 106/72 122/81 117/77 106/64  ?Pulse: 81 63 69 (!) 59  ?Resp:  16    ?Temp:      ?TempSrc:      ?Weight:      ?Height:      ?  ?FHTs 120s, gSTV, NST R ?Toco q1-2 ?SVE 6/C/0 ? ?Continue induction. ?

## 2022-02-26 NOTE — Lactation Note (Signed)
This note was copied from a baby's chart. ?Lactation Consultation Note ? ?Patient Name: Tamara Mccormick ?Today's Date: 02/26/2022 ?Reason for consult: Follow-up assessment;Mother's request;Difficult latch;Term;Maternal endocrine disorder ?Age:42 hours ? ?LC assisted with latching in football with signs of milk transfer. Infant still latched and feeding at the end of the visit.  ? ?Maternal Data ?Has patient been taught Hand Expression?: Yes ?Does the patient have breastfeeding experience prior to this delivery?: No ? ?Feeding ?Mother's Current Feeding Choice: Breast Milk ? ?LATCH Score ?Latch: Repeated attempts needed to sustain latch, nipple held in mouth throughout feeding, stimulation needed to elicit sucking reflex. ? ?Audible Swallowing: Spontaneous and intermittent ? ?Type of Nipple: Everted at rest and after stimulation ? ?Comfort (Breast/Nipple): Soft / non-tender ? ?Hold (Positioning): Assistance needed to correctly position infant at breast and maintain latch. ? ?LATCH Score: 8 ? ? ?Lactation Tools Discussed/Used ?  ? ?Interventions ?Interventions: Breast feeding basics reviewed;Assisted with latch;Skin to skin;Breast massage;Hand express;Breast compression;Adjust position;Support pillows;Position options;Expressed milk;Infant Driven Feeding Algorithm education ? ?Discharge ?Pump: Personal ? ?Consult Status ?Consult Status: Follow-up ?Date: 02/27/22 ?Follow-up type: In-patient ? ? ? ?Chi Woodham  Nicholson-Springer ?02/26/2022, 12:25 PM ? ? ? ?

## 2022-02-27 LAB — CBC
HCT: 32 % — ABNORMAL LOW (ref 36.0–46.0)
Hemoglobin: 10.5 g/dL — ABNORMAL LOW (ref 12.0–15.0)
MCH: 28.6 pg (ref 26.0–34.0)
MCHC: 32.8 g/dL (ref 30.0–36.0)
MCV: 87.2 fL (ref 80.0–100.0)
Platelets: 184 10*3/uL (ref 150–400)
RBC: 3.67 MIL/uL — ABNORMAL LOW (ref 3.87–5.11)
RDW: 13.6 % (ref 11.5–15.5)
WBC: 9.6 10*3/uL (ref 4.0–10.5)
nRBC: 0 % (ref 0.0–0.2)

## 2022-02-27 NOTE — Progress Notes (Signed)
Patient is doing well.  She is ambulating, voiding, tolerating PO.  Pain control is good.  Lochia is appropriate ? ?Vitals:  ? 02/26/22 1231 02/26/22 1745 02/26/22 2145 02/27/22 0655  ?BP: 123/82 109/82 113/78 110/70  ?Pulse: (!) 51 (!) 54 65 68  ?Resp: 17 16 18 18   ?Temp: 97.8 ?F (36.6 ?C) 97.8 ?F (36.6 ?C) 98.6 ?F (37 ?C) 98.1 ?F (36.7 ?C)  ?TempSrc: Oral Oral Oral Oral  ?SpO2:  99% 100% 100%  ?Weight:      ?Height:      ? ? ?NAD ?Fundus firm ?Ext:  ? ?Lab Results  ?Component Value Date  ? WBC 9.6 02/27/2022  ? HGB 10.5 (L) 02/27/2022  ? HCT 32.0 (L) 02/27/2022  ? MCV 87.2 02/27/2022  ? PLT 184 02/27/2022  ? ? ?--/--/O NEG (04/19 0752) ? ?A/P 41 y.o. G1P1001 PPD#1. ?Routine care.   ?Rh negative: baby Rh negative--rhogam not indicated ?Desires discharge to home today--meeting all goal.  Reports baby will be discharged if feeding goes well today ? ? ?Tamara Mccormick ? ?

## 2022-02-27 NOTE — Lactation Note (Signed)
This note was copied from a baby's chart. ?Lactation Consultation Note ? ?Patient Name: Tamara Mccormick ?Today's Date: 02/27/2022 ?Reason for consult: Follow-up assessment;Mother's request;Primapara;1st time breastfeeding;Term ?Age:42 hours ? ?Lactation responded to a request by Tamara Mccormick and her support person. They requested lactation to observe and provide reassurance regarding baby's latch. They are interested in discharge today, if eligible. ? ?Baby was showing some feeding cues. I recommended that Tamara Mccormick HE first; her colostrum easily expresses. I observed her latch baby to the breast on the left side in cross cradle hold. However, baby quickly released the breast and fell asleep. I showed her how to hold the breast in a "U" hold and compress the tissue slightly closer to baby's mouth. I then gently woke baby up, and she latched this time with more depth. I helped with positioning and gently "pestering" baby to stay active. ? ?I noted rhythmic suckling sequences at the breast. I observed the feeding on this breast and provided education regarding positioning and the feeding plan at the bedside. ? ?I recommended that she breast feed on demand 8-12 times a day and to offer both breasts in a feeding. I encouraged her to wake baby up and switch breasts, as needed. I educated on day 2 infant feeding patterns. ? ?Tamara Mccormick plans to follow up with [facility]. She has a Nutritional therapist conducting a home visit on 4/22.  ? ?I conducted discharge education and reviewed our community breast feeding resources. I encouraged her to call, as needed, for questions, and to attend the breast feeding support group. ? ?Maternal Data ?Has patient been taught Hand Expression?: Yes ?Does the patient have breastfeeding experience prior to this delivery?: No ? ?Feeding ?Mother's Current Feeding Choice: Breast Milk ? ?LATCH Score ?Latch: Grasps breast easily, tongue down, lips flanged, rhythmical  sucking. ? ?Audible Swallowing: A few with stimulation ? ?Type of Nipple: Everted at rest and after stimulation ? ?Comfort (Breast/Nipple): Soft / non-tender ? ?Hold (Positioning): Assistance needed to correctly position infant at breast and maintain latch. ? ?LATCH Score: 8 ? ?  ?Interventions ?Interventions: Breast feeding basics reviewed;Assisted with latch;Skin to skin;Hand express;Breast compression;Adjust position;Support pillows;Education ? ?Discharge ?Discharge Education: Outpatient recommendation;Other (comment);Warning signs for feeding baby Information systems manager Resources) ?Pump: Personal ? ?Consult Status ?Consult Status: Complete ?Date: 02/27/22 ?Follow-up type: Call as needed ? ? ? ?Walker Shadow ?02/27/2022, 12:11 PM ? ? ? ?

## 2022-02-27 NOTE — Discharge Summary (Signed)
? ?  Postpartum Discharge Summary ? ?Date of Service updated 02/27/22 ? ?   ?Patient Name: Tamara Mccormick ?DOB: [birth year] ?MRN: [id number] ? ?Date of admission: 02/25/2022 ?Delivery date:02/26/2022  ?Delivering provider: Carrington Clamp  ?Date of discharge: 02/27/2022 ? ?Admitting diagnosis: AMA (advanced maternal age) primigravida 35+, third trimester [O09.513] ?Intrauterine pregnancy: [redacted]w[redacted]d     ?Secondary diagnosis:  Principal Problem: ?  AMA (advanced maternal age) primigravida 27+, third trimester ? ?Additional problems: None    ?Discharge diagnosis: Term Pregnancy Delivered                                              ?Augmentation: Pitocin and OP Foley ?Complications: None ? ?Hospital course: Induction of Labor With Vaginal Delivery   ?41 y.o. yo G1P1001 at [redacted]w[redacted]d was admitted to the hospital 02/25/2022 for induction of labor.  Indication for induction: AMA.  Patient had an uncomplicated labor course as follows: ?Membrane Rupture Time/Date: 9:10 AM ,02/25/2022   ?Delivery Method:Vaginal, Spontaneous  ?Episiotomy: None  ?Lacerations:  Perineal  ?Details of delivery can be found in separate delivery note.  Patient had a routine postpartum course. Patient is discharged home 02/27/22. ? ?Newborn Data: ?Birth date:02/26/2022  ?Birth time:5:20 AM  ?Gender:Female  ?Living status:Living  ?Apgars:9 ,9  ?Weight:3033 g  ? ? ?Physical exam  ?Vitals:  ? 02/26/22 1745 02/26/22 2145 02/27/22 0655 02/27/22 1448  ?BP: 109/82 113/78 110/70 120/81  ?Pulse: (!) 54 65 68 60  ?Resp: 16 18 18 17   ?Temp: 97.8 ?F (36.6 ?C) 98.6 ?F (37 ?C) 98.1 ?F (36.7 ?C) 98.6 ?F (37 ?C)  ?TempSrc: Oral Oral Oral Oral  ?SpO2: 99% 100% 100% 100%  ?Weight:      ?Height:      ? ?General: alert, cooperative, and no distress ?Lochia: appropriate ?Uterine Fundus: firm ?DVT Evaluation: No evidence of DVT seen on physical exam. ?Labs: ?Lab Results  ?Component Value Date  ? WBC 9.6 02/27/2022  ? HGB 10.5 (L) 02/27/2022  ? HCT 32.0 (L) 02/27/2022  ? MCV 87.2  02/27/2022  ? PLT 184 02/27/2022  ? ?   ? View : No data to display.  ?  ?  ?  ? ?Edinburgh Score: ? ?  02/26/2022  ?  7:50 AM  ?02/28/2022 Postnatal Depression Scale Screening Tool  ?I have been able to laugh and see the funny side of things. 0  ?I have looked forward with enjoyment to things. 0  ?I have blamed myself unnecessarily when things went wrong. 1  ?I have been anxious or worried for no good reason. 1  ?I have felt scared or panicky for no good reason. 0  ?Things have been getting on top of me. 0  ?I have been so unhappy that I have had difficulty sleeping. 1  ?I have felt sad or miserable. 0  ?I have been so unhappy that I have been crying. 0  ?The thought of harming myself has occurred to me. 0  ?Edinburgh Postnatal Depression Scale Total 3  ? ? ? ? ?After visit meds:  ?Allergies as of 02/27/2022   ?No Known Allergies ?  ? ?  ?Medication List  ?  ? ?STOP taking these medications   ? ?Accu-Chek Guide test strip ?Generic drug: glucose blood ?  ?Accu-Chek Softclix Lancets lancets ?  ?aspirin EC 81 MG tablet ?  ?FOLIC ACID PO ?  ?  VITAMIN B6 PO ?  ? ?  ? ?TAKE these medications   ? ?acetaminophen 500 MG tablet ?Commonly known as: TYLENOL ?Take 1,000 mg by mouth every 6 (six) hours as needed for mild pain or headache. ?  ?famotidine 20 MG tablet ?Commonly known as: PEPCID ?Take 20 mg by mouth 2 (two) times daily as needed for heartburn. ?  ?PRENATAL PO ?Take 1 tablet by mouth daily. ?  ?sertraline 100 MG tablet ?Commonly known as: ZOLOFT ?Take 100 mg by mouth daily. ?  ? ?  ? ? ? ?Discharge home in stable condition ?Infant Feeding: Breast ?Infant Disposition:home with mother ?Discharge instruction: per After Visit Summary and Postpartum booklet. ?Activity: Advance as tolerated. Pelvic rest for 6 weeks.  ?Diet: routine diet ?Anticipated Birth Control: Unsure ?Postpartum Appointment:4 weeks ?Additional Postpartum F/U:  n/a ?Future Appointments: ?Future Appointments  ?Date Time Provider Department Center   ?01/31/2024  8:15 AM Janalyn Harder, MD [facility]  ? ?Follow up Visit: ? Follow-up Information   ? ? Carrington Clamp, MD Follow up in 4 week(s).   ?Specialty: Obstetrics and Gynecology ?Contact information: ?[address] ?[address] ?[place] [address] ?[phone] ? ? ?  ?  ? ?  ?  ? ?  ? ? ? ?  ? ?02/27/2022 ?Amor Hyle GEFFEL Chestine Spore, MD ? ? ?

## 2022-03-07 ENCOUNTER — Telehealth (HOSPITAL_COMMUNITY): Payer: Self-pay

## 2022-03-07 NOTE — Telephone Encounter (Signed)
No answer. Left message to return nurse call. ? Heron Nay ?03/07/2022,1143 ?

## 2022-05-09 IMAGING — US US BREAST*L* LIMITED INC AXILLA
1 series · 12 of 12 positions shown · non-contrast
Comparison: Previous exam(s).

CLINICAL DATA: 39-year-old female presenting as a recall from
baseline screening for possible left breast mass.

EXAM:
DIGITAL DIAGNOSTIC UNILATERAL LEFT MAMMOGRAM WITH TOMOSYNTHESIS AND
CAD; ULTRASOUND LEFT BREAST LIMITED
TECHNIQUE: Left digital diagnostic mammography and breast tomosynthesis was
performed. The images were evaluated with computer-aided detection.;
Targeted ultrasound examination of the left breast was performed

[Series 1: us breast*left* limited inc axilla · 0.05mm/px · 12 of 12 slices shown]
[im 1/12]
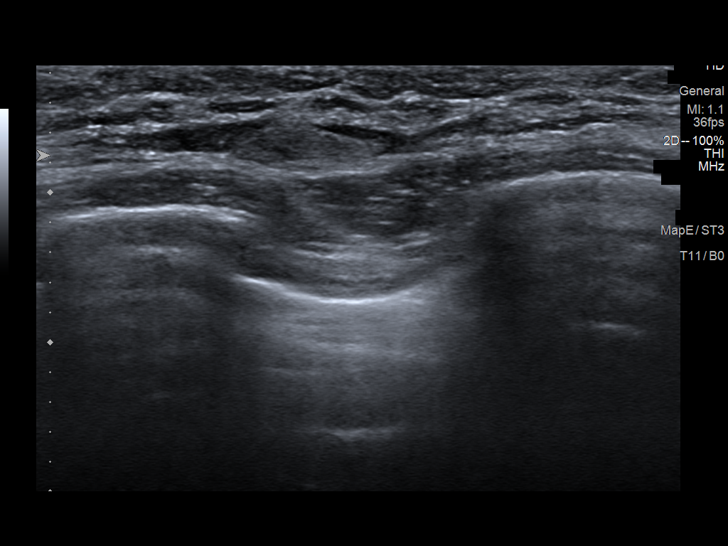
[im 2/12]
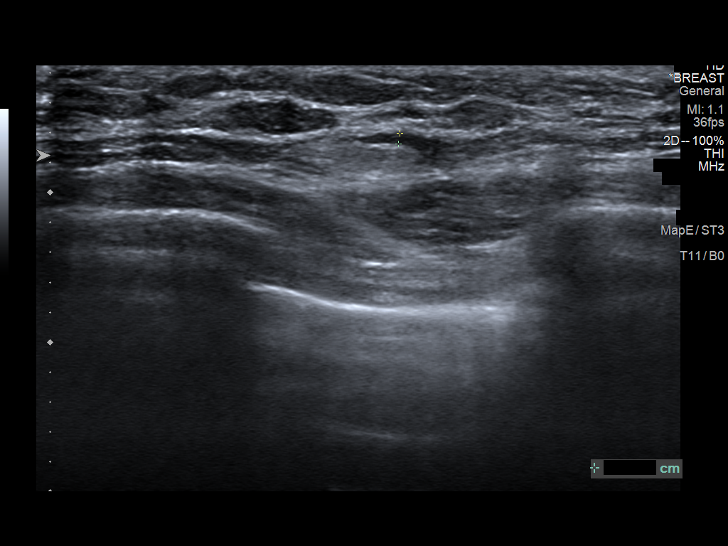
[im 3/12]
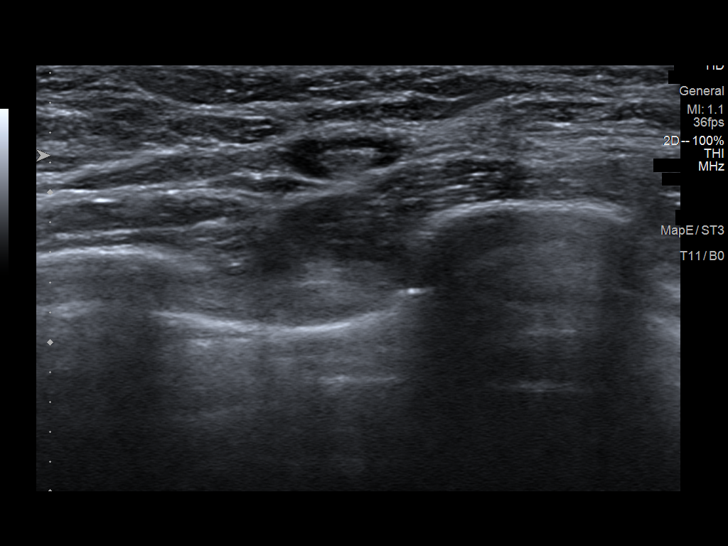
[im 4/12]
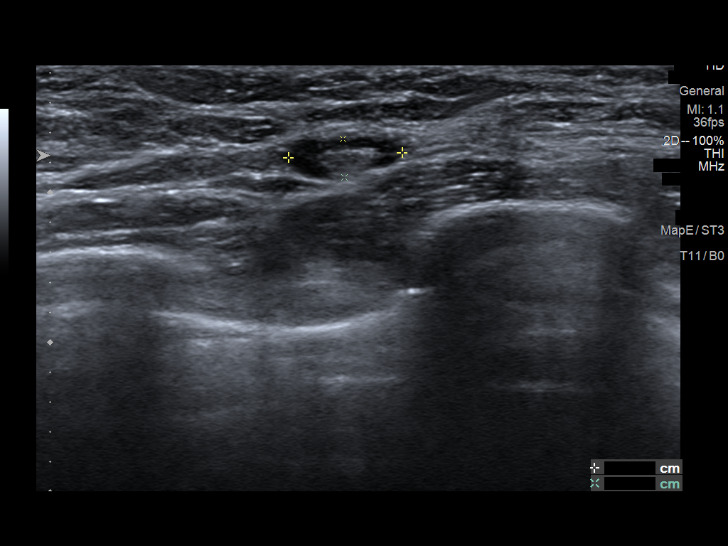
[im 5/12]
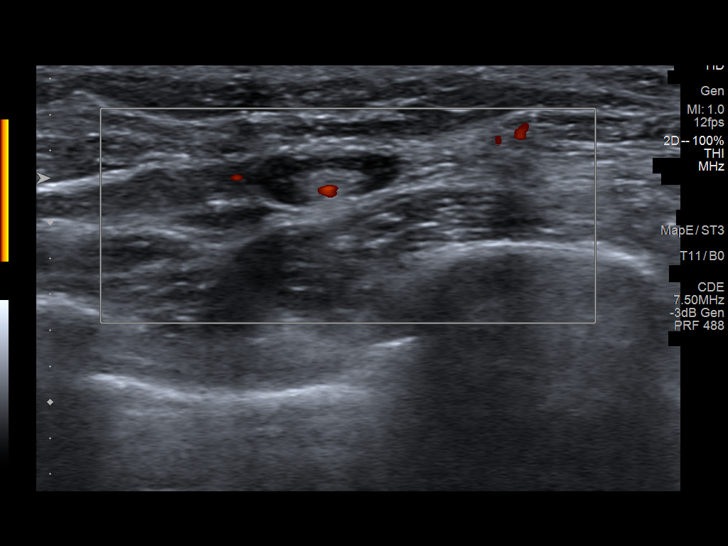
[im 6/12]
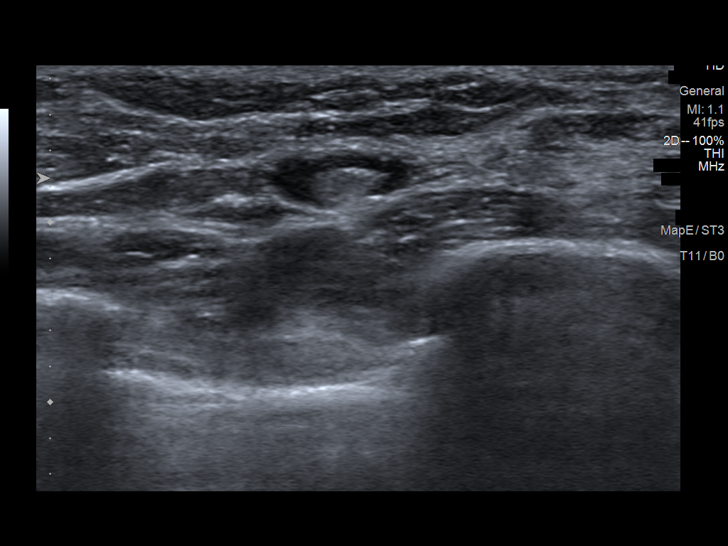
[im 7/12]
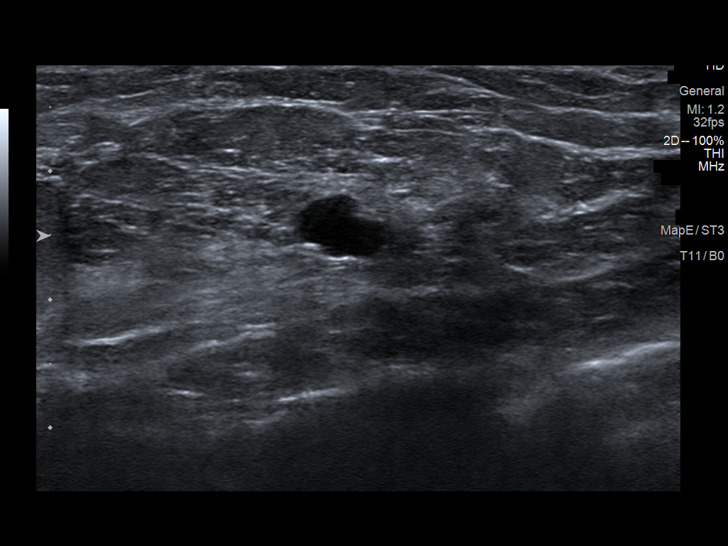
[im 8/12]
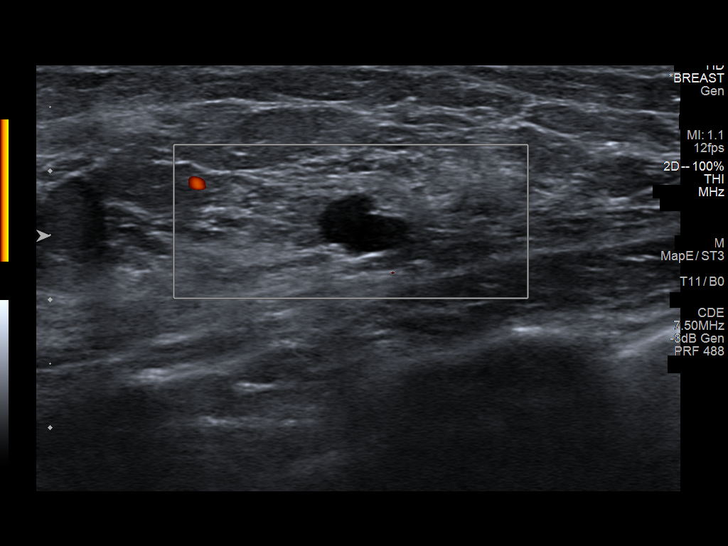
[im 9/12]
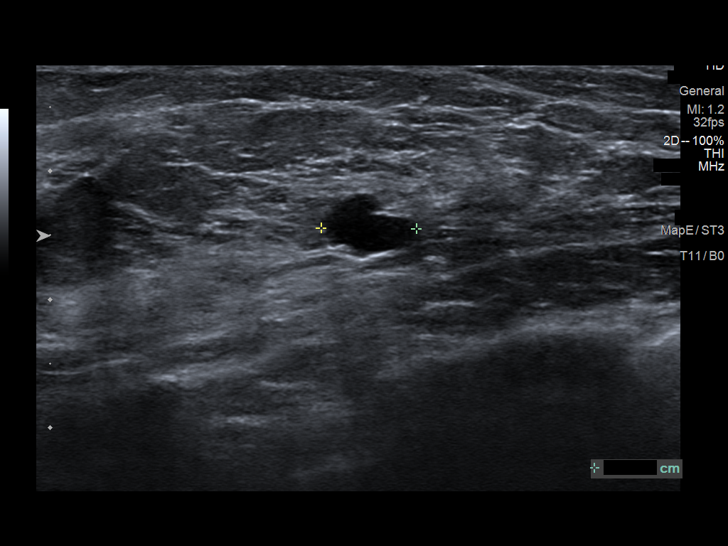
[im 10/12]
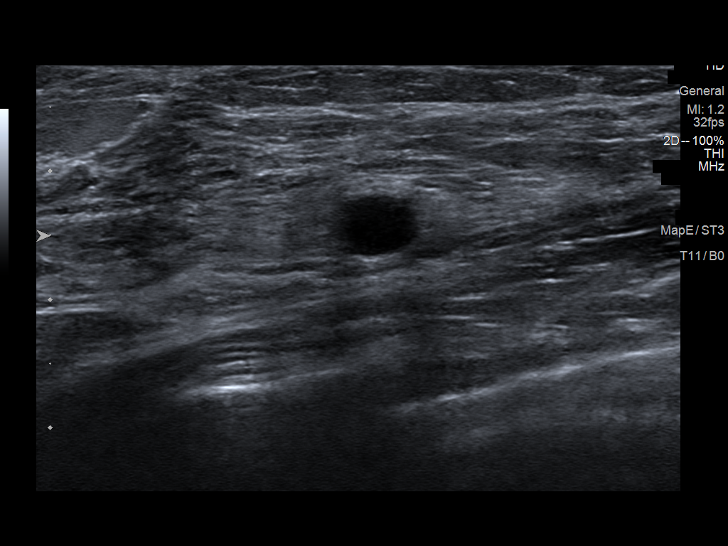
[im 11/12]
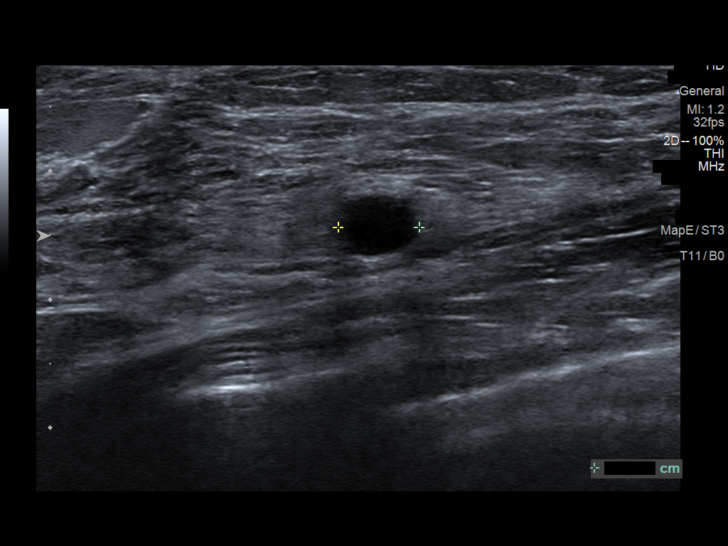
[im 12/12]
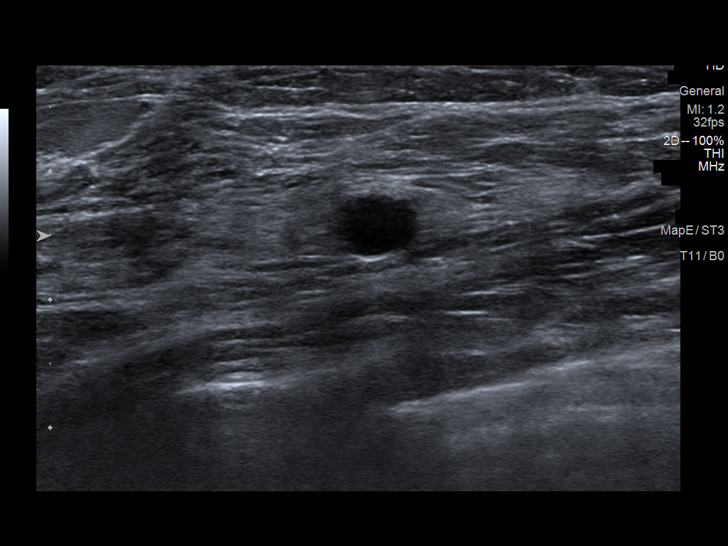

[12 of 12 positions shown; findings below may reference images not displayed]

ACR Breast Density Category c: The breast tissue is heterogeneously
dense, which may obscure small masses.
FINDINGS: Mammogram:

Spot compression tomosynthesis MLO and full field mL tomosynthesis
views of the left breast performed demonstrating persistence of an
oval circumscribed mass in the superior and far posterior left
breast measuring approximately 0.5 cm.

On physical exam of the superior left breast I do not palpate a
fixed discrete mass.

Ultrasound:

Targeted ultrasound performed in the left breast at 3 o'clock 8 cm
from the nipple demonstrating a benign intramammary lymph node
measuring 0.8 cm, which is likely incidental.

At 2 o'clock 5 cm from the nipple there is an oval circumscribed
anechoic mass measuring 0.7 x 0.5 x 0.6 cm, consistent with a benign
simple cyst. No internal vascularity. This corresponds to the
finding identified mammographically.
IMPRESSION: 1.  Benign simple cyst in the left breast at 2 o'clock.

2.  Benign intramammary lymph node in the left breast at 3 o'clock.

RECOMMENDATION:
Screening mammogram in one year.(Code:SO-W-P2N)

I have discussed the findings and recommendations with the patient.
If applicable, a reminder letter will be sent to the patient
regarding the next appointment.

BI-RADS CATEGORY  2: Benign.

## 2022-05-09 IMAGING — MG MM DIGITAL DIAGNOSTIC UNILAT*L* W/ TOMO W/ CAD
4 series · 4 of 12 positions shown · non-contrast
Comparison: Previous exam(s).

CLINICAL DATA: 39-year-old female presenting as a recall from
baseline screening for possible left breast mass.

EXAM:
DIGITAL DIAGNOSTIC UNILATERAL LEFT MAMMOGRAM WITH TOMOSYNTHESIS AND
CAD; ULTRASOUND LEFT BREAST LIMITED
TECHNIQUE: Left digital diagnostic mammography and breast tomosynthesis was
performed. The images were evaluated with computer-aided detection.;
Targeted ultrasound examination of the left breast was performed

[L MLO synth-2D]
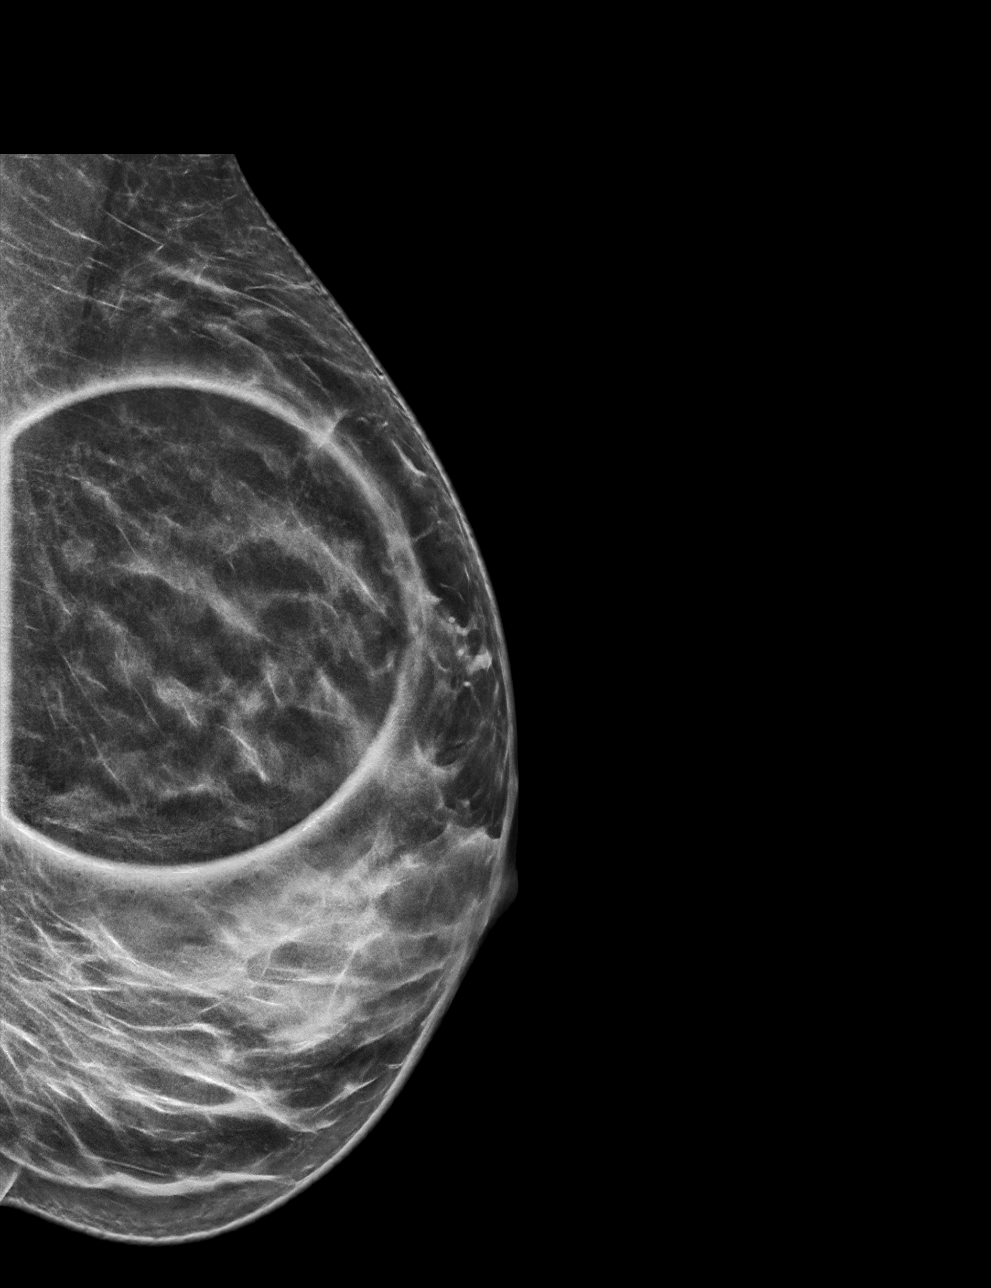

[L ML synth-2D]
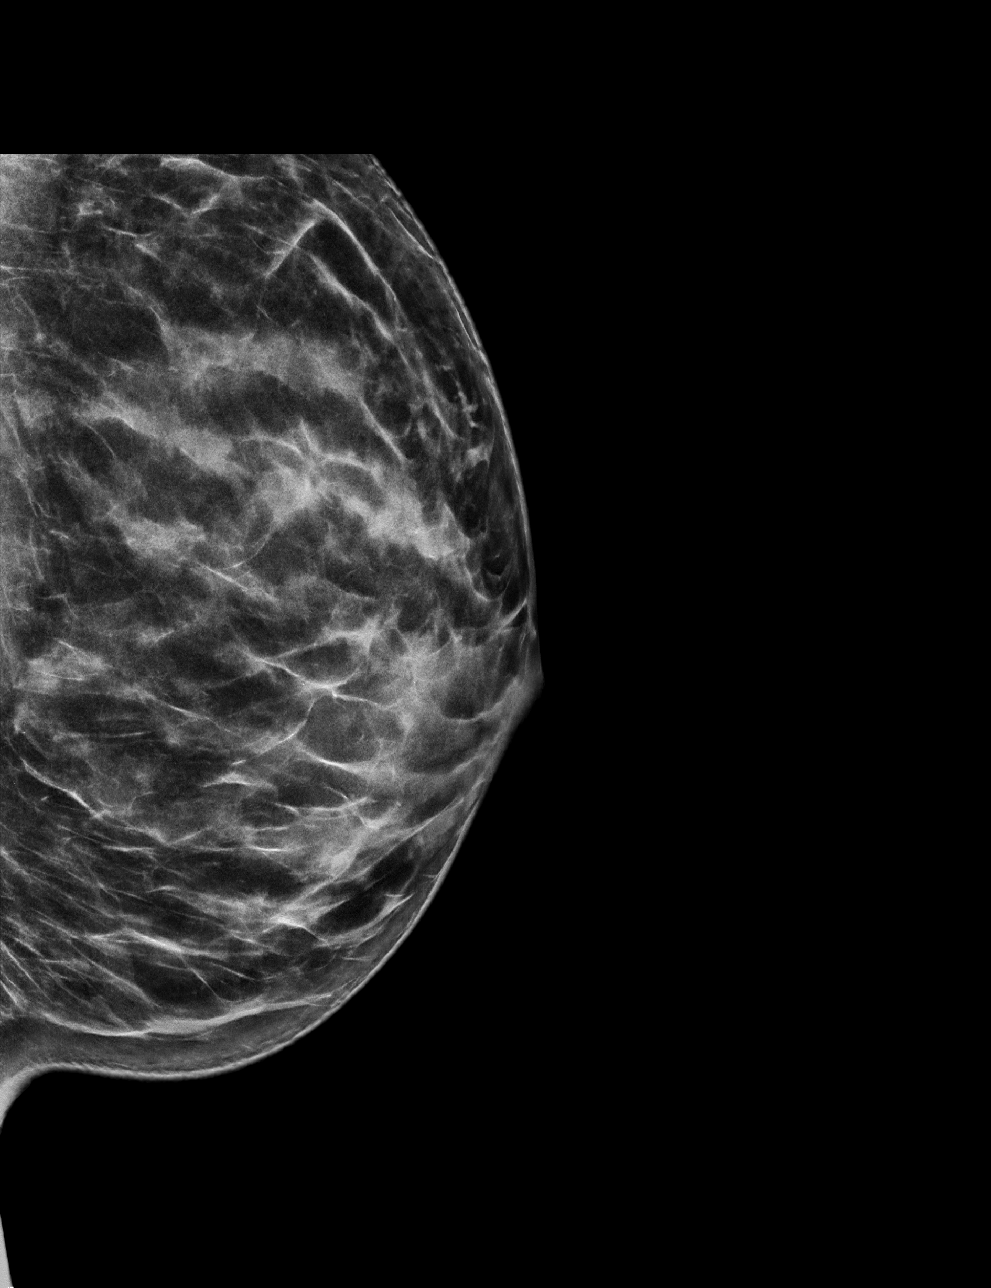

[L ML tomo · tomo slice 33/64.0]
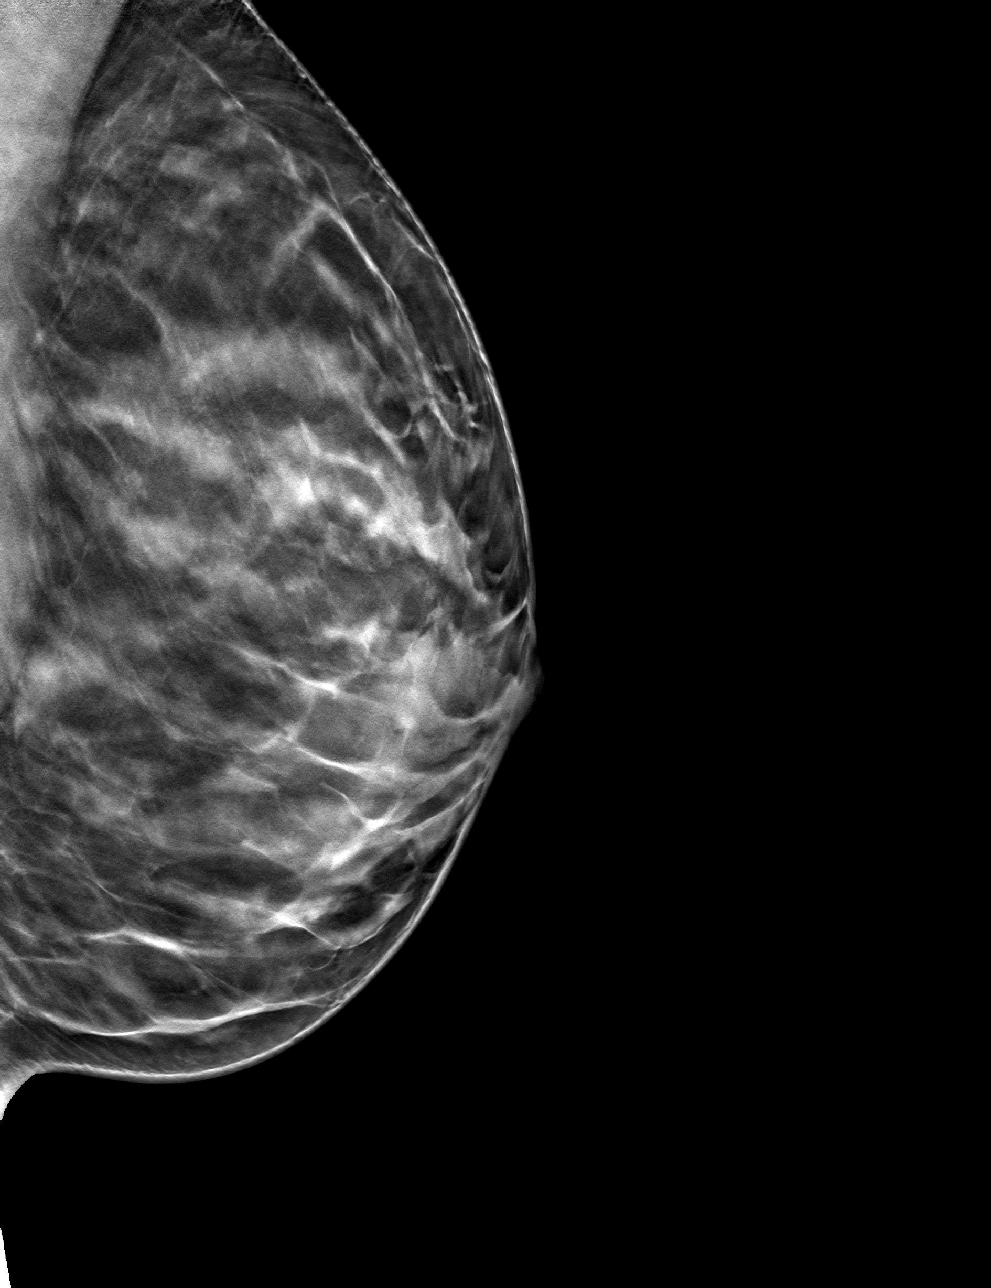

[L MLO tomo · tomo slice 33/64.0]
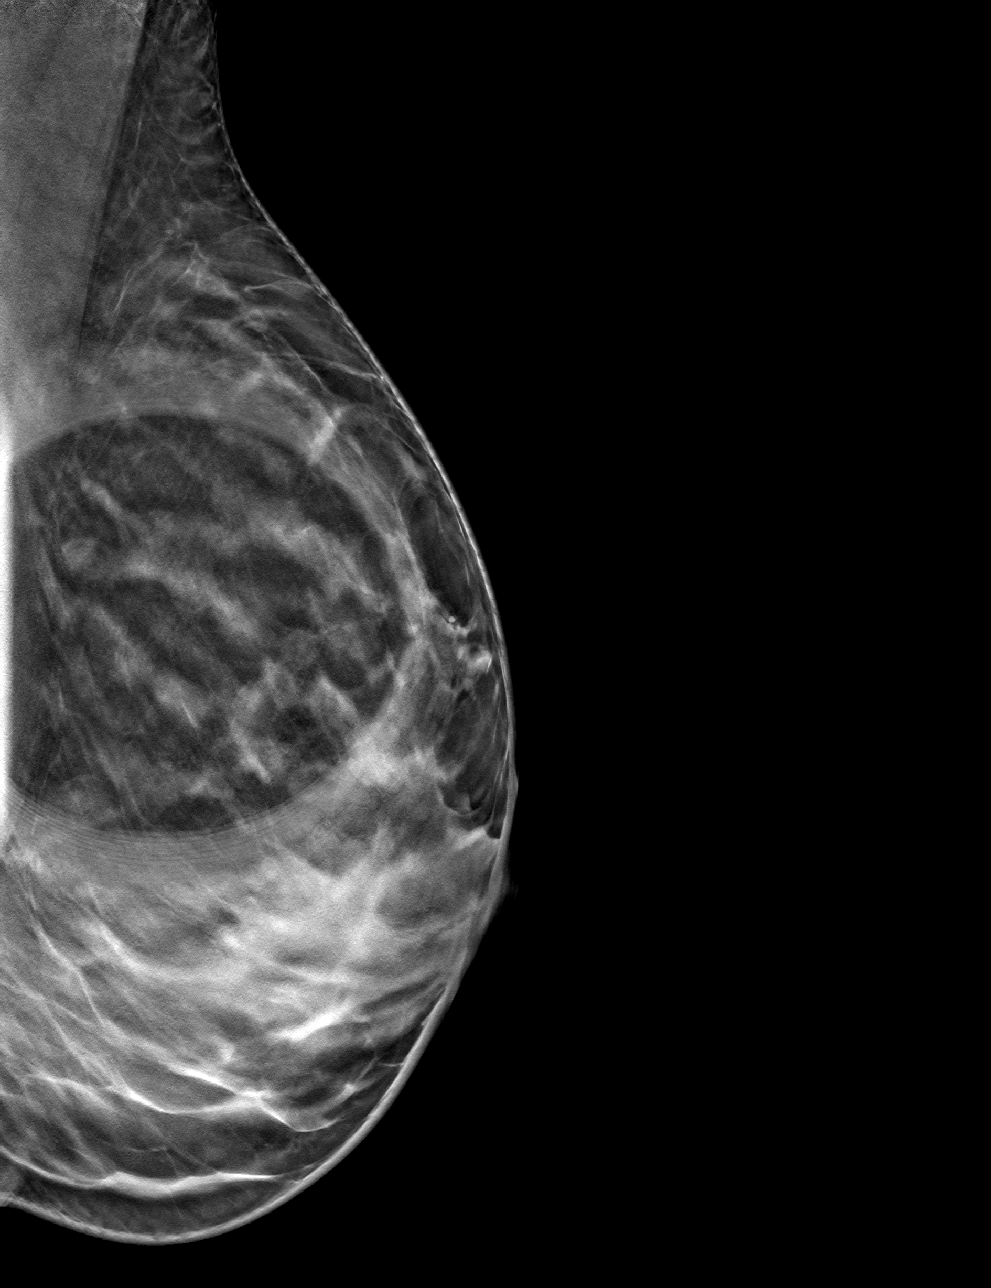

[4 of 12 positions shown; findings below may reference images not displayed]

ACR Breast Density Category c: The breast tissue is heterogeneously
dense, which may obscure small masses.
FINDINGS: Mammogram:

Spot compression tomosynthesis MLO and full field mL tomosynthesis
views of the left breast performed demonstrating persistence of an
oval circumscribed mass in the superior and far posterior left
breast measuring approximately 0.5 cm.

On physical exam of the superior left breast I do not palpate a
fixed discrete mass.

Ultrasound:

Targeted ultrasound performed in the left breast at 3 o'clock 8 cm
from the nipple demonstrating a benign intramammary lymph node
measuring 0.8 cm, which is likely incidental.

At 2 o'clock 5 cm from the nipple there is an oval circumscribed
anechoic mass measuring 0.7 x 0.5 x 0.6 cm, consistent with a benign
simple cyst. No internal vascularity. This corresponds to the
finding identified mammographically.
IMPRESSION: 1.  Benign simple cyst in the left breast at 2 o'clock.

2.  Benign intramammary lymph node in the left breast at 3 o'clock.

RECOMMENDATION:
Screening mammogram in one year.(Code:[id number]-[id number])

I have discussed the findings and recommendations with the patient.
If applicable, a reminder letter will be sent to the patient
regarding the next appointment.

BI-RADS CATEGORY  2: Benign.

## 2024-01-31 ENCOUNTER — Ambulatory Visit: Payer: BC Managed Care – PPO | Admitting: Dermatology

## 2024-05-17 ENCOUNTER — Other Ambulatory Visit: Payer: Self-pay | Admitting: Obstetrics and Gynecology

## 2024-05-17 DIAGNOSIS — R928 Other abnormal and inconclusive findings on diagnostic imaging of breast: Secondary | ICD-10-CM

## 2024-05-23 ENCOUNTER — Ambulatory Visit

## 2024-05-23 ENCOUNTER — Ambulatory Visit
Admission: RE | Admit: 2024-05-23 | Discharge: 2024-05-23 | Disposition: A | Discharge status: Home / Self Care | Source: Ambulatory Visit | Attending: Obstetrics and Gynecology | Admitting: Obstetrics and Gynecology

## 2024-05-23 DIAGNOSIS — R928 Other abnormal and inconclusive findings on diagnostic imaging of breast: Secondary | ICD-10-CM
